# Patient Record
Sex: Male | Born: 2000 | Hispanic: Yes | Marital: Single | Smoking: Never smoker
Health system: Southern US, Community
[De-identification: ages and names within clinical notes are randomized; demographics above are authoritative.]

## PROBLEM LIST (undated history)

## (undated) ENCOUNTER — Ambulatory Visit: Admission: EM | Payer: Commercial Managed Care - HMO

## (undated) DIAGNOSIS — S60511A Abrasion of right hand, initial encounter: Secondary | ICD-10-CM

## (undated) DIAGNOSIS — R609 Edema, unspecified: Secondary | ICD-10-CM

---
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
## 2019-01-16 ENCOUNTER — Ambulatory Visit (INDEPENDENT_AMBULATORY_CARE_PROVIDER_SITE_OTHER): Payer: Self-pay

## 2019-01-16 ENCOUNTER — Ambulatory Visit: Payer: Self-pay | Admitting: Physician Assistant

## 2019-01-16 ENCOUNTER — Encounter: Payer: Self-pay | Admitting: Physician Assistant

## 2019-01-16 ENCOUNTER — Ambulatory Visit (INDEPENDENT_AMBULATORY_CARE_PROVIDER_SITE_OTHER): Payer: Self-pay | Admitting: Physician Assistant

## 2019-01-16 VITALS — BP 100/62 | HR 81 | Temp 98.3°F | Ht 70.0 in | Wt 178.0 lb

## 2019-01-16 DIAGNOSIS — Z1159 Encounter for screening for other viral diseases: Secondary | ICD-10-CM

## 2019-01-16 DIAGNOSIS — R1032 Left lower quadrant pain: Secondary | ICD-10-CM

## 2019-01-16 DIAGNOSIS — R197 Diarrhea, unspecified: Secondary | ICD-10-CM

## 2019-01-16 LAB — SARS CORONAVIRUS 2 (TAT 6-24 HRS): SARS Coronavirus 2: NEGATIVE

## 2019-01-16 MED ORDER — NA SULFATE-K SULFATE-MG SULF 17.5-3.13-1.6 GM/177ML PO SOLN: 1.0000 | Freq: Once | ORAL | 0 refills | Status: AC

## 2019-01-16 NOTE — Progress Notes (Signed)
Chief Complaint: Left lower quadrant abdominal pain and diarrhea  HPI:    Aaron Shea is a 18 year old Hispanic male, who presents clinic accompanied by his mother who assists with history, who was referred to me by Angeline Slim, MD for a complaint of left lower quadrant abdominal pain and diarrhea.      11/01/2018 patient seen in the ED for persistent left lower quadrant abdominal pain and intermittent episodes of nausea vomiting and diarrhea.  Labs including lipase, CMP and CBC were normal.  CT abdomen pelvis with contrast with no acute abdominopelvic abnormality.  Patient was given Bentyl.    Today, the patient tells me that back in March he was out in the yard playing and all of a sudden started with an acute left lower quadrant abdominal pain which resulted in 2 days of diarrhea 3-4 times a day with continual abdominal cramping rated as a 5-6/10.  He went to see an urgent care center who diagnosed him with diverticulitis and gave him Ciprofloxacin.  The pain went away after about 2 to 3 days.  He then continued to have this left lower quadrant pain about once a month for 2 or 3 days which is crampy with no radiation, which also results in more frequent loose stools during those days with some nausea.  In September he went to the ER during one of these periods (labs and imaging as above).  At that time he was given Levsin but he does not think it really helped.    Social history positive for working with his father in [employer].    Denies family history of IBD.  Denies fever, chills, weight loss, anorexia, food triggers, activity triggers, blood in his stool or symptoms that awaken him from sleep.  Past Medical History:  Diagnosis Date  . Abrasion of right hand 10/15/2014   dorsum  . Soft tissue swelling of back 10/2014   right    Past Surgical History:  Procedure Laterality Date  . MASS EXCISION Right 10/21/2014   Procedure: EXCISION OF SOFT TISSUE SWELLING ON RIGHT BACK ;   Surgeon: Gerald Stabs, MD;  Location: [facility];  Service: Pediatrics;  Laterality: Right;    Current Outpatient Medications  Medication Sig Dispense Refill  . dicyclomine (BENTYL) 20 MG tablet Take 1 tablet (20 mg total) by mouth every 6 (six) hours as needed for spasms (abdominal cramping). 15 tablet 0  . ondansetron (ZOFRAN ODT) 4 MG disintegrating tablet Take 1 tablet (4 mg total) by mouth every 8 (eight) hours as needed for nausea or vomiting. 6 tablet 0   No current facility-administered medications for this visit.     Allergies as of 01/16/2019  . (No Known Allergies)    Family History  Problem Relation Age of Onset  . Hypertension Paternal Grandmother   . Healthy Mother   . Healthy Father   . Colon cancer Neg Hx   . Pancreatic cancer Neg Hx   . Colon polyps Neg Hx   . Stomach cancer Neg Hx     Social History   Socioeconomic History  . Marital status: Single    Spouse name: Not on file  . Number of children: Not on file  . Years of education: Not on file  . Highest education level: Not on file  Occupational History  . Not on file  Social Needs  . Financial resource strain: Not on file  . Food insecurity    Worry: Not on file  Inability: Not on file  . Transportation needs    Medical: Not on file    Non-medical: Not on file  Tobacco Use  . Smoking status: Never Smoker  . Smokeless tobacco: Never Used  Substance and Sexual Activity  . Alcohol use: No  . Drug use: No  . Sexual activity: Yes    Partners: Female    Birth control/protection: Condom  Lifestyle  . Physical activity    Days per week: Not on file    Minutes per session: Not on file  . Stress: Not on file  Relationships  . Social Herbalist on phone: Not on file    Gets together: Not on file    Attends religious service: Not on file    Active member of club or organization: Not on file    Attends meetings of clubs or organizations: Not on file    Relationship  status: Not on file  . Intimate partner violence    Fear of current or ex partner: Not on file    Emotionally abused: Not on file    Physically abused: Not on file    Forced sexual activity: Not on file  Other Topics Concern  . Not on file  Social History Narrative  . Not on file    Review of Systems:    Constitutional: No weight loss, fever or chills Skin: No rash  Cardiovascular: No chest pain Respiratory: No SOB  Gastrointestinal: See HPI and otherwise negative Genitourinary: No dysuria  Neurological: No headache, dizziness or syncope Musculoskeletal: No new muscle or joint pain Hematologic: No bleeding  Psychiatric: No history of depression or anxiety   Physical Exam:  Vital signs: BP 100/62   Pulse 81   Temp 98.3 F (36.8 C)   Ht 5\' 10"  (1.778 m)   Wt 178 lb (80.7 kg)   BMI 25.54 kg/m   Constitutional:   Pleasant Hispanic male appears to be in NAD, Well developed, Well nourished, alert and cooperative Head:  Normocephalic and atraumatic. Eyes:   PEERL, EOMI. No icterus. Conjunctiva pink. Ears:  Normal auditory acuity. Neck:  Supple Throat: Oral cavity and pharynx without inflammation, swelling or lesion.  Respiratory: Respirations even and unlabored. Lungs clear to auscultation bilaterally.   No wheezes, crackles, or rhonchi.  Cardiovascular: Normal S1, S2. No MRG. Regular rate and rhythm. No peripheral edema, cyanosis or pallor.  Gastrointestinal:  Soft, nondistended, mild LLQ ttp to deep palpation, No rebound or guarding. Normal bowel sounds. No appreciable masses or hepatomegaly. Rectal:  Not performed.  Msk:  Symmetrical without gross deformities. Without edema, no deformity or joint abnormality.  Neurologic:  Alert and  oriented x4;  grossly normal neurologically.  Skin:   Dry and intact without significant lesions or rashes. Psychiatric:  Demonstrates good judgement and reason without abnormal affect or behaviors.  RELEVANT LABS AND IMAGING: CBC     Component Value Date/Time   WBC 8.3 11/01/2018 1916   RBC 5.11 11/01/2018 1916   HGB 15.4 11/01/2018 1916   HCT 46.4 11/01/2018 1916   PLT 220 11/01/2018 1916   MCV 90.8 11/01/2018 1916   MCH 30.1 11/01/2018 1916   MCHC 33.2 11/01/2018 1916   RDW 11.3 (L) 11/01/2018 1916    CMP     Component Value Date/Time   NA 138 11/01/2018 1916   K 3.9 11/01/2018 1916   CL 102 11/01/2018 1916   CO2 27 11/01/2018 1916   GLUCOSE 103 (H) 11/01/2018 1916  BUN 11 11/01/2018 1916   CREATININE 1.00 11/01/2018 1916   CALCIUM 9.2 11/01/2018 1916   PROT 7.6 11/01/2018 1916   ALBUMIN 4.4 11/01/2018 1916   AST 27 11/01/2018 1916   ALT 32 11/01/2018 1916   ALKPHOS 53 11/01/2018 1916   BILITOT 0.7 11/01/2018 1916   GFRNONAA >60 11/01/2018 1916   GFRAA >60 11/01/2018 1916    Assessment: 1.  Left lower quadrant abdominal pain: Cyclical, for 2 to 3 days once a month for the past 9 months, ER evaluation during 1 of these episodes with negative/normal CT and labs, always accompanied by diarrhea; consider IBD versus IBS versus other 2.  Diarrhea  Plan: 1.  Scheduled patient for a diagnostic colonoscopy in the [place] with Dr. Ardis Hughs.  Did discuss risks, benefits, limitations and alternatives and patient agrees to proceed.  Patient will be Covid tested 2 days prior to time of exam. 2.  Patient to follow in clinic per recommendations from Dr. Ardis Hughs after time of procedure.  Ellouise Newer, PA-C [facility] 01/16/2019, 11:26 AM  Cc: Angeline Slim, MD

## 2019-01-16 NOTE — Patient Instructions (Signed)
If you are age 19 or older, your body mass index should be between 23-30. Your Body mass index is 25.54 kg/m. If this is out of the aforementioned range listed, please consider follow up with your Primary Care Provider.  If you are age 29 or younger, your body mass index should be between 19-25. Your Body mass index is 25.54 kg/m. If this is out of the aformentioned range listed, please consider follow up with your Primary Care Provider.   You have been scheduled for a colonoscopy. Please follow written instructions given to you at your visit today.  Please pick up your prep supplies at the pharmacy within the next 1-3 days. If you use inhalers (even only as needed), please bring them with you on the day of your procedure.  Thank you for choosing me and [facility]

## 2019-01-16 NOTE — Progress Notes (Signed)
I agree with the above note, plan 

## 2019-01-20 ENCOUNTER — Ambulatory Visit (AMBULATORY_SURGERY_CENTER): Payer: Self-pay | Admitting: Gastroenterology

## 2019-01-20 ENCOUNTER — Other Ambulatory Visit: Payer: Self-pay

## 2019-01-20 ENCOUNTER — Encounter: Payer: Self-pay | Admitting: Gastroenterology

## 2019-01-20 VITALS — BP 105/57 | HR 80 | Temp 98.8°F | Resp 13 | Ht 70.0 in | Wt 178.0 lb

## 2019-01-20 DIAGNOSIS — R1032 Left lower quadrant pain: Secondary | ICD-10-CM

## 2019-01-20 MED ORDER — HYOSCYAMINE SULFATE 0.125 MG SL SUBL: SUBLINGUAL_TABLET | SUBLINGUAL | 3 refills | Status: DC

## 2019-01-20 MED ORDER — SODIUM CHLORIDE 0.9 % IV SOLN: 500.0000 mL | Freq: Once | INTRAVENOUS | Status: DC

## 2019-01-20 NOTE — Patient Instructions (Signed)
YOU HAD AN ENDOSCOPIC PROCEDURE TODAY AT [facility]:   Refer to the procedure report that was given to you for any specific questions about what was found during the examination.  If the procedure report does not answer your questions, please call your gastroenterologist to clarify.  If you requested that your care partner not be given the details of your procedure findings, then the procedure report has been included in a sealed envelope for you to review at your convenience later.  YOU SHOULD EXPECT: Some feelings of bloating in the abdomen. Passage of more gas than usual.  Walking can help get rid of the air that was put into your GI tract during the procedure and reduce the bloating. If you had a lower endoscopy (such as a colonoscopy or flexible sigmoidoscopy) you may notice spotting of blood in your stool or on the toilet paper. If you underwent a bowel prep for your procedure, you may not have a normal bowel movement for a few days.  Please Note:  You might notice some irritation and congestion in your nose or some drainage.  This is from the oxygen used during your procedure.  There is no need for concern and it should clear up in a day or so.  SYMPTOMS TO REPORT IMMEDIATELY:   Following lower endoscopy (colonoscopy or flexible sigmoidoscopy):  Excessive amounts of blood in the stool  Significant tenderness or worsening of abdominal pains  Swelling of the abdomen that is new, acute  Fever of 100F or higher   For urgent or emergent issues, a gastroenterologist can be reached at any hour by calling [phone].   DIET:  We do recommend a small meal at first, but then you may proceed to your regular diet.  Drink plenty of fluids but you should avoid alcoholic beverages for 24 hours.  ACTIVITY:  You should plan to take it easy for the rest of today and you should NOT DRIVE or use heavy machinery until tomorrow (because of the sedation medicines used during the test).     FOLLOW UP: Our staff will call the number listed on your records 48-72 hours following your procedure to check on you and address any questions or concerns that you may have regarding the information given to you following your procedure. If we do not reach you, we will leave a message.  We will attempt to reach you two times.  During this call, we will ask if you have developed any symptoms of COVID 19. If you develop any symptoms (ie: fever, flu-like symptoms, shortness of breath, cough etc.) before then, please call [phone].  If you test positive for Covid 19 in the 2 weeks post procedure, please call and report this information to [place].    If any biopsies were taken you will be contacted by phone or by letter within the next 1-3 weeks.  Please call us at [phone] if you have not heard about the biopsies in 3 weeks.    SIGNATURES/CONFIDENTIALITY: You and/or your care partner have signed paperwork which will be entered into your electronic medical record.  These signatures attest to the fact that that the information above on your After Visit Summary has been reviewed and is understood.  Full responsibility of the confidentiality of this discharge information lies with you and/or your care-partner.   Take levsin 0.125 mg 1-2 under tongue prn for cramping,resume remainder of medication. Call Dr. Ardis Hughs office 3-4 weeks to report response.

## 2019-01-20 NOTE — Progress Notes (Signed)
A and O x3. Report to RN. Tolerated MAC anesthesia well.

## 2019-01-20 NOTE — Op Note (Signed)
[facility] Patient Name: Aaron Shea Procedure Date: 01/20/2019 2:01 PM MRN: [id number] Endoscopist: Milus Banister , MD Age: 18 Referring MD:  Date of Birth: 2000-04-16 Gender: Male Account #: [id number] Procedure:                Colonoscopy Indications:              Abdominal pain in the left lower quadrant Medicines:                Monitored Anesthesia Care Procedure:                Pre-Anesthesia Assessment:                           - Prior to the procedure, a History and Physical                            was performed, and patient medications and                            allergies were reviewed. The patient's tolerance of                            previous anesthesia was also reviewed. The risks                            and benefits of the procedure and the sedation                            options and risks were discussed with the patient.                            All questions were answered, and informed consent                            was obtained. Prior Anticoagulants: The patient has                            taken no previous anticoagulant or antiplatelet                            agents. ASA Grade Assessment: I - A normal, healthy                            patient. After reviewing the risks and benefits,                            the patient was deemed in satisfactory condition to                            undergo the procedure.                           After obtaining informed consent, the colonoscope  was passed under direct vision. Throughout the                            procedure, the patient's blood pressure, pulse, and                            oxygen saturations were monitored continuously. The                            Colonoscope was introduced through the anus and                            advanced to the the terminal ileum. The colonoscopy                            was performed without  difficulty. The patient                            tolerated the procedure well. The quality of the                            bowel preparation was excellent. Scope In: 2:12:55 PM Scope Out: 2:23:59 PM Scope Withdrawal Time: 0 hours 8 minutes 14 seconds  Total Procedure Duration: 0 hours 11 minutes 4 seconds  Findings:                 The terminal ileum appeared normal.                           The entire examined colon appeared normal on direct                            and retroflexion views. Complications:            No immediate complications. Estimated blood loss:                            None. Estimated Blood Loss:     Estimated blood loss: none. Impression:               - The examined portion of the ileum was normal.                           - The entire examined colon is normal on direct and                            retroflexion views.                           - No specimens collected. Recommendation:           - Patient has a contact number available for                            emergencies. The signs and symptoms of potential  delayed complications were discussed with the                            patient. Return to normal activities tomorrow.                            Written discharge instructions were provided to the                            patient.                           - Resume previous diet.                           - Continue present medications. New script today:                            Trial of levsin 0.125mg  SL tabs, take 1-2 tabs                            under tongue PRN cramping, disp 50 with 3 refills                            and call Dr. Ardis Hughs' office in 3-4 weeks to report                            on your response. Milus Banister, MD 01/20/2019 2:29:30 PM This report has been signed electronically.

## 2019-01-22 ENCOUNTER — Telehealth: Payer: Self-pay | Admitting: *Deleted

## 2019-01-22 NOTE — Telephone Encounter (Signed)
  Follow up Call-  Call back number 01/20/2019  Post procedure Call Back phone  # [phone]  Permission to leave phone message Yes  Some recent data might be hidden     Patient questions:  Do you have a fever, pain , or abdominal swelling? No. Pain Score  0 *  Have you tolerated food without any problems? Yes.    Have you been able to return to your normal activities? Yes.    Do you have any questions about your discharge instructions: Diet   No. Medications  No. Follow up visit  No.  Do you have questions or concerns about your Care? No.  Actions: * If pain score is 4 or above: 1. No action needed, pain <4.Have you developed a fever since your procedure? no  2.   Have you had an respiratory symptoms (SOB or cough) since your procedure? no  3.   Have you tested positive for COVID 19 since your procedure no  4.   Have you had any family members/close contacts diagnosed with the COVID 19 since your procedure?  no   If yes to any of these questions please route to Joylene John, RN and Alphonsa Gin, Therapist, sports.

## 2019-01-26 ENCOUNTER — Telehealth: Payer: Self-pay | Admitting: Gastroenterology

## 2019-01-26 DIAGNOSIS — R1032 Left lower quadrant pain: Secondary | ICD-10-CM

## 2019-01-26 NOTE — Telephone Encounter (Signed)
The pt continues to have LLQ pain.  Had colonoscopy on 12/8 and states the pain has not gotten any better but is no worse. Has tried levsin but he states it makes no difference in the pain when he takes it.  Please advise

## 2019-01-29 ENCOUNTER — Other Ambulatory Visit (INDEPENDENT_AMBULATORY_CARE_PROVIDER_SITE_OTHER): Payer: Medicaid Other

## 2019-01-29 DIAGNOSIS — R1032 Left lower quadrant pain: Secondary | ICD-10-CM

## 2019-01-29 LAB — CBC WITH DIFFERENTIAL/PLATELET
Basophils Absolute: 0.1 10*3/uL (ref 0.0–0.1)
Basophils Relative: 1 % (ref 0.0–3.0)
Eosinophils Absolute: 0.2 10*3/uL (ref 0.0–0.7)
Eosinophils Relative: 2.1 % (ref 0.0–5.0)
HCT: 46.1 % (ref 36.0–49.0)
Hemoglobin: 16.1 g/dL — ABNORMAL HIGH (ref 12.0–16.0)
Lymphocytes Relative: 34.8 % (ref 24.0–48.0)
Lymphs Abs: 2.9 10*3/uL (ref 0.7–4.0)
MCHC: 34.9 g/dL (ref 31.0–37.0)
MCV: 87 fl (ref 78.0–98.0)
Monocytes Absolute: 0.7 10*3/uL (ref 0.1–1.0)
Monocytes Relative: 8.1 % (ref 3.0–12.0)
Neutro Abs: 4.4 10*3/uL (ref 1.4–7.7)
Neutrophils Relative %: 54 % (ref 43.0–71.0)
Platelets: 248 10*3/uL (ref 150.0–575.0)
RBC: 5.3 Mil/uL (ref 3.80–5.70)
RDW: 12.3 % (ref 11.4–15.5)
WBC: 8.2 10*3/uL (ref 4.5–13.5)

## 2019-01-29 LAB — URINALYSIS, ROUTINE W REFLEX MICROSCOPIC
Bilirubin Urine: NEGATIVE
Hgb urine dipstick: NEGATIVE
Ketones, ur: NEGATIVE
Leukocytes,Ua: NEGATIVE
Nitrite: NEGATIVE
RBC / HPF: NONE SEEN (ref 0–?)
Specific Gravity, Urine: 1.02 (ref 1.000–1.030)
Total Protein, Urine: NEGATIVE
Urine Glucose: NEGATIVE
Urobilinogen, UA: 0.2 (ref 0.0–1.0)
WBC, UA: NONE SEEN (ref 0–?)
pH: 7.5 (ref 5.0–8.0)

## 2019-01-29 LAB — COMPREHENSIVE METABOLIC PANEL
ALT: 13 U/L (ref 0–53)
AST: 13 U/L (ref 0–37)
Albumin: 4.6 g/dL (ref 3.5–5.2)
Alkaline Phosphatase: 64 U/L (ref 52–171)
BUN: 12 mg/dL (ref 6–23)
CO2: 31 mEq/L (ref 19–32)
Calcium: 9.7 mg/dL (ref 8.4–10.5)
Chloride: 101 mEq/L (ref 96–112)
Creatinine, Ser: 0.98 mg/dL (ref 0.40–1.50)
GFR: 97.8 mL/min (ref >60.00)
Glucose, Bld: 89 mg/dL (ref 70–99)
Potassium: 3.6 mEq/L (ref 3.5–5.1)
Sodium: 139 mEq/L (ref 135–145)
Total Bilirubin: 0.5 mg/dL (ref 0.2–1.2)
Total Protein: 7.8 g/dL (ref 6.0–8.3)

## 2019-01-29 NOTE — Telephone Encounter (Signed)
The pt has been advised and will come in today or tomorrow for labs and UA/cx.  The order has been added to [facility]

## 2019-01-29 NOTE — Telephone Encounter (Signed)
I am not sure what is causing these symptoms.  LEts repeat some labs (cbc, cmet, UA and reflex uCx).  thanks

## 2019-01-29 NOTE — Telephone Encounter (Signed)
Left message on machine to call back  

## 2019-01-29 NOTE — Telephone Encounter (Signed)
Delia, interpreter (on HIPAA) called to ask for an update.

## 2019-01-30 LAB — URINE CULTURE
MICRO NUMBER:: 1208507
Result:: NO GROWTH
SPECIMEN QUALITY:: ADEQUATE

## 2019-02-02 ENCOUNTER — Telehealth: Payer: Self-pay | Admitting: Gastroenterology

## 2019-02-02 NOTE — Telephone Encounter (Signed)
Left message for pt to call back  °

## 2019-02-02 NOTE — Telephone Encounter (Signed)
Pt's friend Delia called asking to speak with you. She id not want to disclose more information. Pls call her.

## 2019-02-02 NOTE — Telephone Encounter (Signed)
See result note.  

## 2020-12-09 IMAGING — CT CT ABD-PELV W/ CM
2 of 4 series · 16 of 46 positions shown, 18 images · IV contrast (omnipaque)
Comparison: None.

CLINICAL DATA: Abdominal pain. Left lower quadrant pain x3 days.
The patient has a history of diverticulitis.

EXAM:
CT ABDOMEN AND PELVIS WITH CONTRAST
TECHNIQUE: Multidetector CT imaging of the abdomen and pelvis was performed
using the standard protocol following bolus administration of
intravenous contrast.
CONTRAST:  100mL OMNIPAQUE IOHEXOL 300 MG/ML  SOLN

[Series 2: axial st · axial · 0.67mm/px · z∈[-470,-46]mm · 13 of 95 slices shown, 15 images]
[im 5/95  soft-tissue]
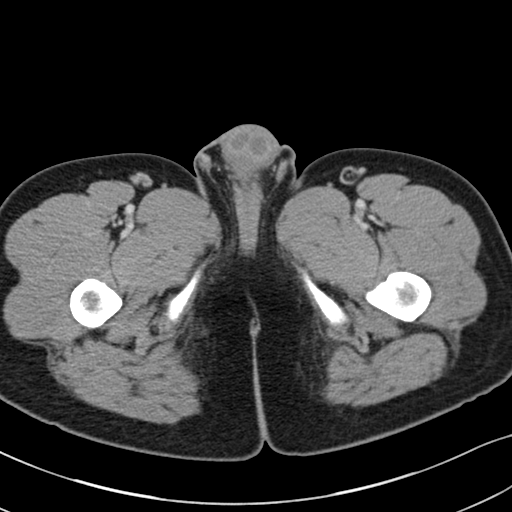
[im 5/95  bone]
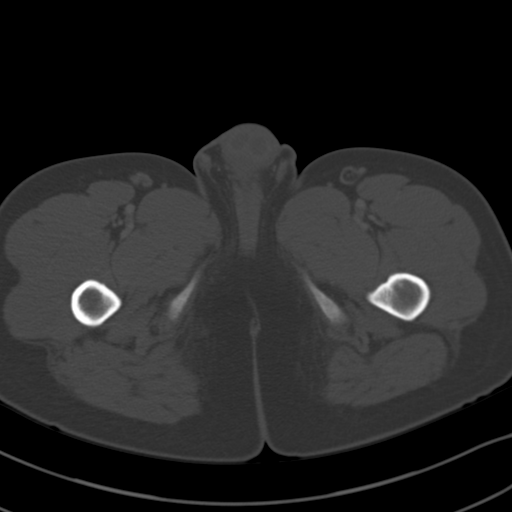
[im 15/95  soft-tissue]
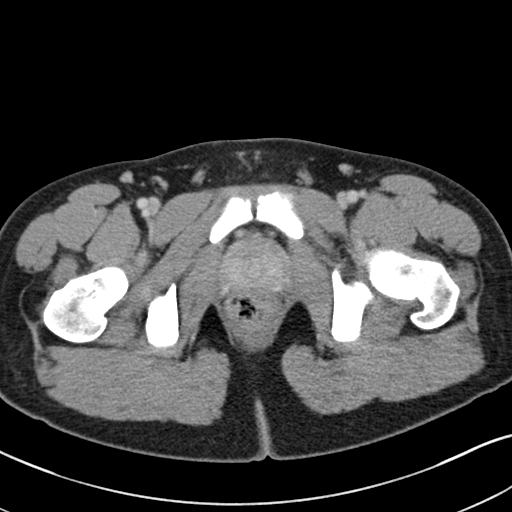
[im 19/95  soft-tissue]
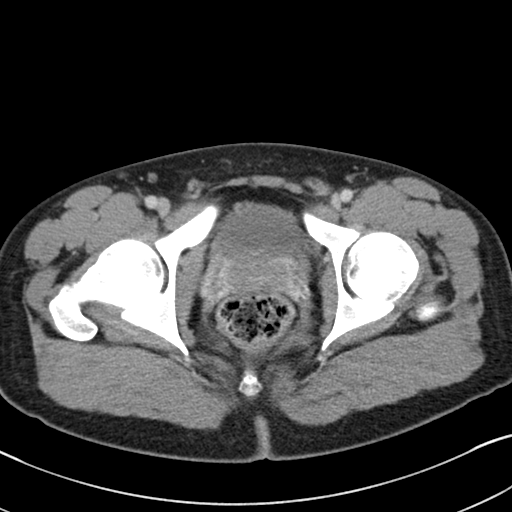
[im 29/95  soft-tissue]
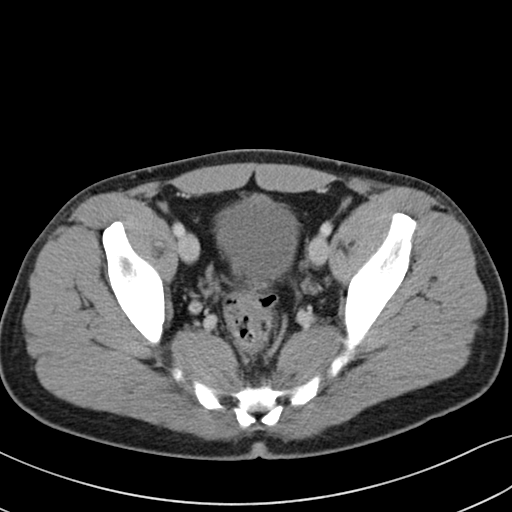
[im 33/95  soft-tissue]
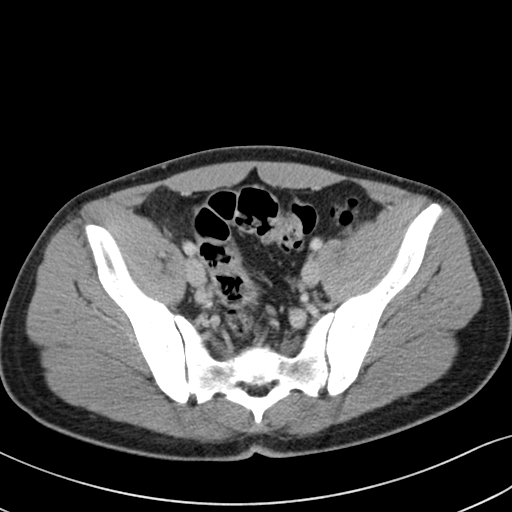
[im 43/95  soft-tissue]
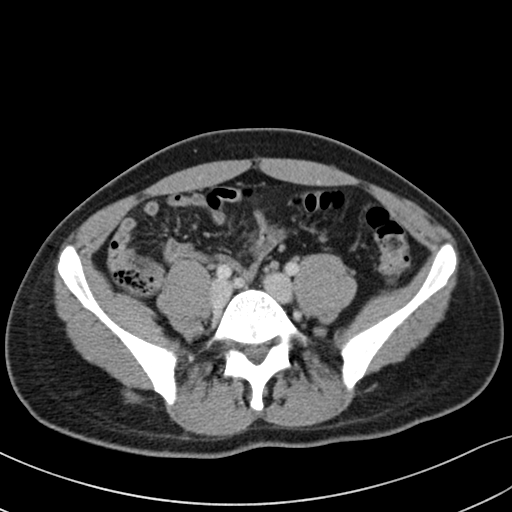
[im 48/95  soft-tissue]
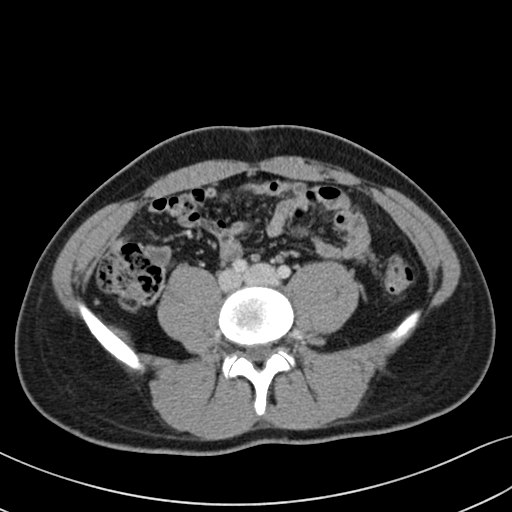
[im 52/95  soft-tissue]
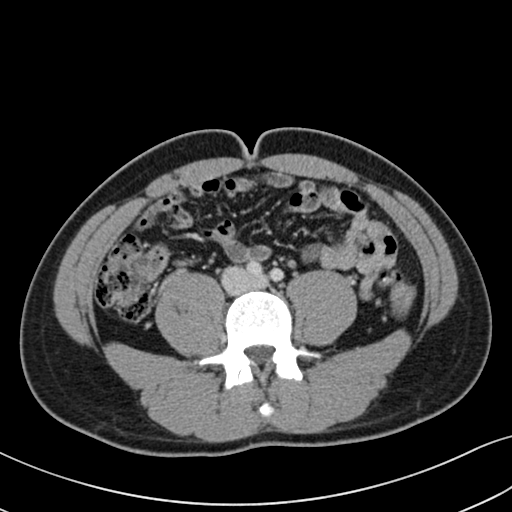
[im 62/95  soft-tissue]
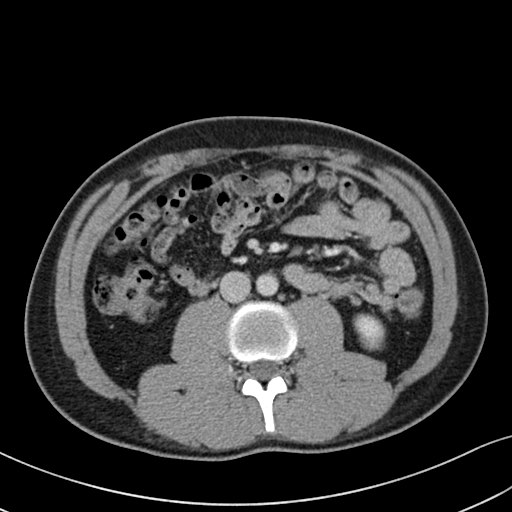
[im 62/95  bone]
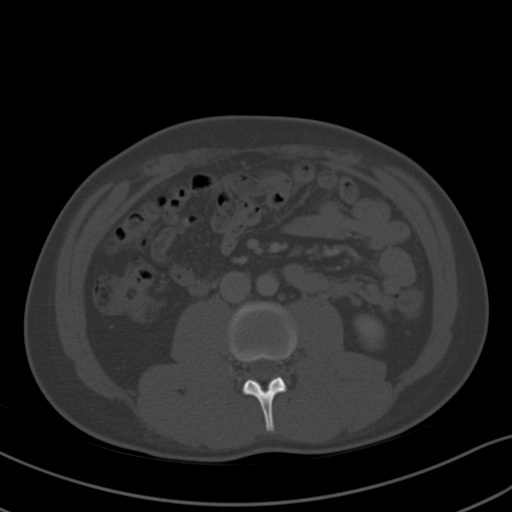
[im 66/95  soft-tissue]
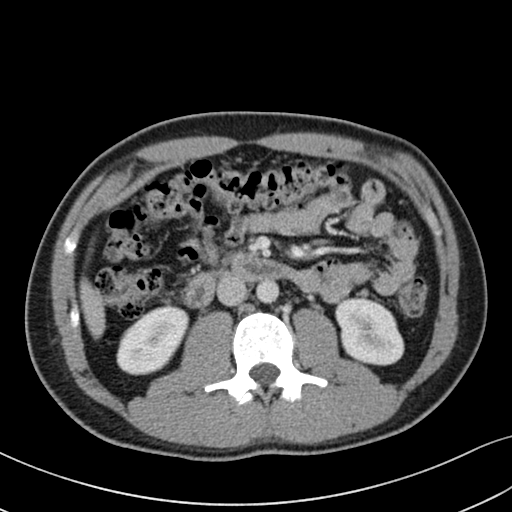
[im 76/95  soft-tissue]
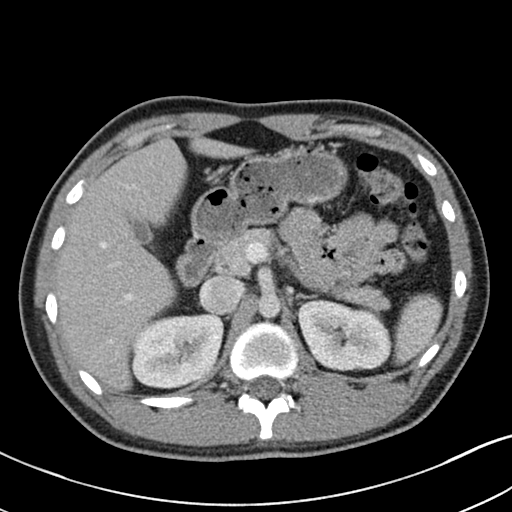
[im 80/95  soft-tissue]
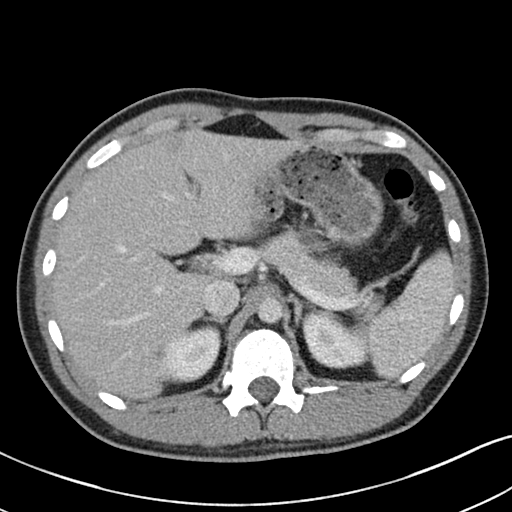
[im 90/95  soft-tissue]
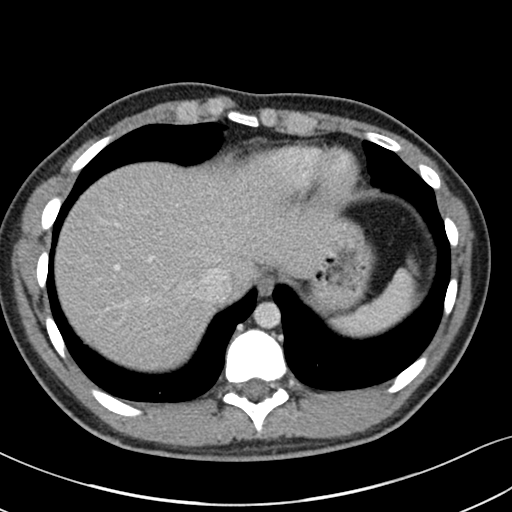

[Series 5: coronal st · coronal · 0.68mm/px · 3 of 136 slices shown]
[im 46/136  soft-tissue]
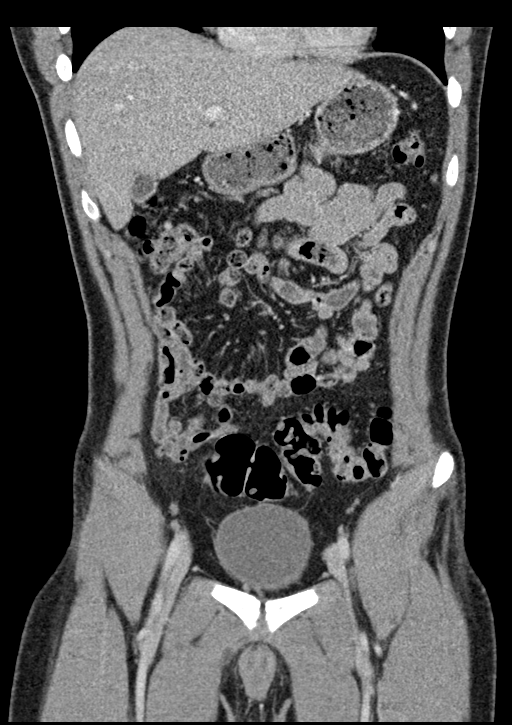
[im 61/136  soft-tissue]
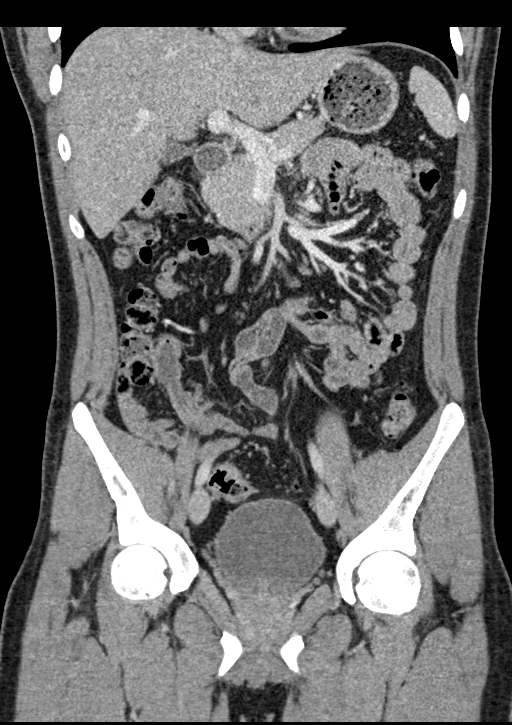
[im 76/136  soft-tissue]
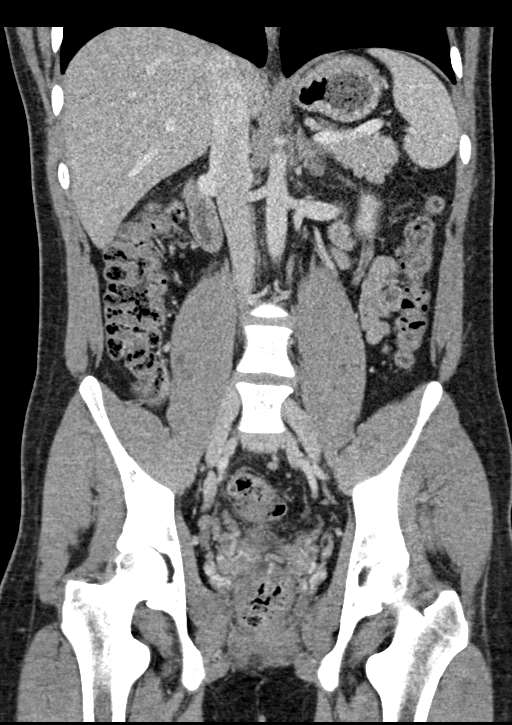

[16 of 46 positions shown; findings below may reference images not displayed]

FINDINGS: Lower chest: The lung bases are clear. The heart size is normal.

Hepatobiliary: The liver is normal. Normal gallbladder.There is no
biliary ductal dilation.

Pancreas: Normal contours without ductal dilatation. No
peripancreatic fluid collection.

Spleen: No splenic laceration or hematoma.

Adrenals/Urinary Tract:

--Adrenal glands: No adrenal hemorrhage.

--Right kidney/ureter: No hydronephrosis or perinephric hematoma.

--Left kidney/ureter: No hydronephrosis or perinephric hematoma.

--Urinary bladder: Unremarkable.

Stomach/Bowel:

--Stomach/Duodenum: No hiatal hernia or other gastric abnormality.
Normal duodenal course and caliber.

--Small bowel: No dilatation or inflammation.

--Colon: No focal abnormality.

--Appendix: Normal.

Vascular/Lymphatic: Normal course and caliber of the major abdominal
vessels.

--No retroperitoneal lymphadenopathy.

--No mesenteric lymphadenopathy.

--No pelvic or inguinal lymphadenopathy.

Reproductive: Unremarkable

Other: No ascites or free air. The abdominal wall is normal.

Musculoskeletal. No acute displaced fractures.
IMPRESSION: No acute abdominopelvic abnormality.

## 2021-09-23 ENCOUNTER — Other Ambulatory Visit: Payer: Self-pay

## 2021-09-23 ENCOUNTER — Encounter (HOSPITAL_COMMUNITY): Payer: Self-pay

## 2021-09-23 ENCOUNTER — Emergency Department (HOSPITAL_COMMUNITY)
Admission: EM | Admit: 2021-09-23 | Discharge: 2021-09-23 | Disposition: A | Discharge status: Home / Self Care | Payer: Commercial Managed Care - HMO | Attending: Emergency Medicine | Admitting: Emergency Medicine

## 2021-09-23 DIAGNOSIS — T7840XA Allergy, unspecified, initial encounter: Secondary | ICD-10-CM | POA: Insufficient documentation

## 2021-09-23 MED ORDER — DIPHENHYDRAMINE HCL 50 MG/ML IJ SOLN
25.0000 mg | Freq: Once | INTRAMUSCULAR | Status: AC
  Administered 2021-09-23: 25 mg via INTRAVENOUS
  Filled 2021-09-23: qty 1

## 2021-09-23 MED ORDER — FAMOTIDINE IN NACL 20-0.9 MG/50ML-% IV SOLN
20.0000 mg | Freq: Once | INTRAVENOUS | Status: AC
  Administered 2021-09-23: 20 mg via INTRAVENOUS
  Filled 2021-09-23: qty 50

## 2021-09-23 MED ORDER — METHYLPREDNISOLONE SODIUM SUCC 125 MG IJ SOLR
125.0000 mg | Freq: Once | INTRAMUSCULAR | Status: AC
  Administered 2021-09-23: 125 mg via INTRAVENOUS
  Filled 2021-09-23: qty 2

## 2021-09-23 MED ORDER — PREDNISONE 10 MG PO TABS: 20.0000 mg | ORAL_TABLET | Freq: Every day | ORAL | 0 refills | Status: DC

## 2021-09-23 NOTE — ED Provider Notes (Signed)
[facility] DEPT Provider Note   CSN: [id number] Arrival date & time: 09/23/21  2005     History  Chief Complaint  Patient presents with   Allergic Reaction    Aaron Shea is a 21 y.o. male.   Allergic Reaction   Patient with no documented comorbidities presents today due to possible allergic reaction.  Patient was working in the yard, states he was stung by 3 different bees.  Started having itching and feeling there is a tingling in the back of his throat.  This happened at about 7 PM, an hour prior to arrival in the ED.  He has not had any medicine prior to arrival, denies any history of anaphylaxis.  He has been stung by bees before and has had itching but no history of severe allergic reaction or needing an EpiPen or hospitalization.  Denies any chest pain, shortness of breath, nausea, vomiting, abdominal pain.  Home Medications Prior to Admission medications   Medication Sig Start Date End Date Taking? Authorizing Provider  predniSONE (DELTASONE) 10 MG tablet Take 2 tablets (20 mg total) by mouth daily. 09/23/21  Yes Sherrill Raring, PA-C  dicyclomine (BENTYL) 20 MG tablet Take 1 tablet (20 mg total) by mouth every 6 (six) hours as needed for spasms (abdominal cramping). 11/01/18   Francine Graven, DO  hyoscyamine (LEVSIN SL) 0.125 MG SL tablet Take 1-2 tabs under tongue prn cramping,dispense 50 01/20/19   Milus Banister, MD  ondansetron (ZOFRAN ODT) 4 MG disintegrating tablet Take 1 tablet (4 mg total) by mouth every 8 (eight) hours as needed for nausea or vomiting. 11/01/18   Francine Graven, DO      Allergies    Patient has no known allergies.    Review of Systems   Review of Systems  Physical Exam Updated Vital Signs BP 117/68   Pulse 68   Temp 98.3 F (36.8 C) (Oral)   Resp (!) 32   Ht '5\' 10"'$  (1.778 m)   Wt 88 kg   SpO2 100%   BMI 27.84 kg/m  Physical Exam Vitals and nursing note reviewed. Exam conducted with a chaperone  present.  Constitutional:      Appearance: Normal appearance.  HENT:     Head: Normocephalic and atraumatic.     Mouth/Throat:     Comments: Uvula is midline, no oropharyngeal swelling.  Protecting airway.  Handling secretions.  Normal phonation. Eyes:     General: No scleral icterus.       Right eye: No discharge.        Left eye: No discharge.     Extraocular Movements: Extraocular movements intact.     Pupils: Pupils are equal, round, and reactive to light.  Cardiovascular:     Rate and Rhythm: Normal rate and regular rhythm.     Pulses: Normal pulses.     Heart sounds: Normal heart sounds. No murmur heard.    No friction rub. No gallop.  Pulmonary:     Effort: Pulmonary effort is normal. No respiratory distress.     Breath sounds: Normal breath sounds.     Comments: No stridor Abdominal:     General: Abdomen is flat. Bowel sounds are normal. There is no distension.     Palpations: Abdomen is soft.     Tenderness: There is no abdominal tenderness.  Skin:    General: Skin is warm and dry.     Coloration: Skin is not jaundiced.     Comments: Raised erythema  to left shoulder, right lower quadrant.  No dermatographia.  Neurological:     Mental Status: He is alert. Mental status is at baseline.     Coordination: Coordination normal.     ED Results / Procedures / Treatments   Labs (all labs ordered are listed, but only abnormal results are displayed) Labs Reviewed - No data to display  EKG None  Radiology No results found.  Procedures Procedures    Medications Ordered in ED Medications  diphenhydrAMINE (BENADRYL) injection 25 mg (25 mg Intravenous Given 09/23/21 2021)  methylPREDNISolone sodium succinate (SOLU-MEDROL) 125 mg/2 mL injection 125 mg (125 mg Intravenous Given 09/23/21 2022)  famotidine (PEPCID) IVPB 20 mg premix (0 mg Intravenous Stopped 09/23/21 2052)    ED Course/ Medical Decision Making/ A&P                           Medical Decision  Making Risk Prescription drug management.   Patient is a 21 year old male presenting today due to allergic reaction.  Differential includes but not limited to anaphylaxis, allergic response, angioedema.  On exam patient is protecting his airway, there is no signs of angioedema.  No change in phonation, uvula is midline and I do not think he is in anaphylaxis.  Exam is notable for raised skins lesions consistent with dermatitis versus early hives.  I considered given epinephrine but patient is not currently in anaphylaxis and I think this is more likely an allergic response.    I ordered Solu-Medrol 125 IV, 20 Pepcid IV, 25 Benadryl IV.  Patient is on cardiac monitoring and we will observe.  I reevaluate the patient about 30 minutes after being given his medicine.  He is feeling significantly improved, denies any tingling in the back of his throat.  Denies any dysphagia, new symptoms.  I do not think patient needs admission or additional observation or epinephrine based on exam and reassuring response.  I discussed with him indications for an EpiPen and I will send 1 to his pharmacy in case he develops a worse allergic reaction, he verbalized understanding that if he were to use the EpiPen he is to call EMS due to concern for arrhythmia and he will come to the ED for evaluation.  We will send steroid burst.   nu      Final Clinical Impression(s) / ED Diagnoses Final diagnoses:  Allergic reaction, initial encounter    Rx / DC Orders ED Discharge Orders          Ordered    predniSONE (DELTASONE) 10 MG tablet  Daily        09/23/21 2105              Sherrill Raring, PA-C 09/23/21 2251    Tretha Sciara, MD 09/23/21 2259

## 2021-09-23 NOTE — Discharge Instructions (Addendum)
We saw you in the ER after you had the allergic reaction.  The reaction is severe, however, it appears to be in control and there is no increased swelling or any difficulty in breathing noted. We are not sure what caused the reaction, and it is important for you to follow up with an allergist. Please take the medications prescribed. PLEASE RETURN TO THE ER IMMEDIATELY IN CASE YOU START HAVING WORSENING SWELLING, DIFFICULTY IN BREATHING ETC.  

## 2021-09-23 NOTE — ED Provider Triage Note (Signed)
Emergency Medicine Provider Triage Evaluation Note  Aaron Shea , a 21 y.o. male  was evaluated in triage.  Pt complains of bee sting about an hour prior to arrival.  He is having hives, feels like his throat is closing up.  History of allergic reactions but never requiring EpiPen, no history of anaphylaxis.  Denies any shortness of breath, nausea, vomiting, abdominal pain, chest pain, shortness of breath.  Patient is companied by his girlfriend..  Review of Systems  PI  Physical Exam  BP (!) 133/90   Pulse 80   Temp 98.6 F (37 C) (Oral)   Resp 12   Ht '5\' 10"'$  (1.778 m)   Wt 88 kg   SpO2 100%   BMI 27.84 kg/m  Gen:   Awake, no distress   Resp:  Normal effort  MSK:   Moves extremities without difficulty  Other:  3 raised erythematous lesions consistent with bee stings, right lower quadrant, left shoulder, chest wall.  Protecting airway, uvula midline.  No stridor on exam.  Medical Decision Making  Medically screening exam initiated at 8:14 PM.  Appropriate orders placed.  Sadrac Zeoli was informed that the remainder of the evaluation will be completed by another provider, this initial triage assessment does not replace that evaluation, and the importance of remaining in the ED until their evaluation is complete.  Allergy meds ordered, patient is not currently in anaphylaxis so we will abstain from epinephrine.  Next room.   Sherrill Raring, PA-C 09/23/21 2015

## 2021-09-23 NOTE — ED Triage Notes (Signed)
Pt has several insect bites to chest and arms, slight swelling in the throat/voice change

## 2022-12-31 ENCOUNTER — Ambulatory Visit (INDEPENDENT_AMBULATORY_CARE_PROVIDER_SITE_OTHER): Payer: 59 | Admitting: Nurse Practitioner

## 2022-12-31 ENCOUNTER — Encounter: Payer: Self-pay | Admitting: Nurse Practitioner

## 2022-12-31 VITALS — BP 114/63 | HR 79 | Temp 97.2°F | Ht 70.0 in | Wt 146.0 lb

## 2022-12-31 DIAGNOSIS — Z0000 Encounter for general adult medical examination without abnormal findings: Secondary | ICD-10-CM

## 2022-12-31 DIAGNOSIS — Z23 Encounter for immunization: Secondary | ICD-10-CM | POA: Insufficient documentation

## 2022-12-31 DIAGNOSIS — Z130 Encounter for screening for diseases of the blood and blood-forming organs and certain disorders involving the immune mechanism: Secondary | ICD-10-CM

## 2022-12-31 DIAGNOSIS — Z1329 Encounter for screening for other suspected endocrine disorder: Secondary | ICD-10-CM | POA: Diagnosis not present

## 2022-12-31 DIAGNOSIS — Z1321 Encounter for screening for nutritional disorder: Secondary | ICD-10-CM | POA: Diagnosis not present

## 2022-12-31 DIAGNOSIS — Z13228 Encounter for screening for other metabolic disorders: Secondary | ICD-10-CM

## 2022-12-31 NOTE — Progress Notes (Signed)
Complete physical exam  Patient: Aaron Shea   DOB: 2000/08/11   22 y.o. Male  MRN: [id number]  Subjective:    Chief Complaint  Patient presents with   Establish Care    Aaron Shea is a 22 y.o. male  with  no significant PMH who presents to establish care and for a complete physical exam. He reports consuming a general diet. The patient does not participate in regular exercise at present. He generally feels well. He reports sleeping well. He does not have additional problems to discuss today.   Flu vaccine given in the office today. Encouraged to get the Tdap vaccine and HPV vaccine if not up-to-date  Most recent fall risk assessment:     No data to display           Most recent depression screenings:    12/31/2022    8:51 AM  PHQ 2/9 Scores  PHQ - 2 Score 0        Patient Care Team: Donell Beers, FNP as PCP - General (Nurse Practitioner)   Outpatient Medications Prior to Visit  Medication Sig   [DISCONTINUED] dicyclomine (BENTYL) 20 MG tablet Take 1 tablet (20 mg total) by mouth every 6 (six) hours as needed for spasms (abdominal cramping).   [DISCONTINUED] hyoscyamine (LEVSIN SL) 0.125 MG SL tablet Take 1-2 tabs under tongue prn cramping,dispense 50   [DISCONTINUED] ondansetron (ZOFRAN ODT) 4 MG disintegrating tablet Take 1 tablet (4 mg total) by mouth every 8 (eight) hours as needed for nausea or vomiting. (Patient not taking: Reported on 12/31/2022)   [DISCONTINUED] predniSONE (DELTASONE) 10 MG tablet Take 2 tablets (20 mg total) by mouth daily. (Patient not taking: Reported on 12/31/2022)   [DISCONTINUED] 0.9 %  sodium chloride infusion    No facility-administered medications prior to visit.    Review of Systems  Constitutional:  Negative for appetite change, chills, fatigue and fever.  HENT:  Negative for congestion, postnasal drip, rhinorrhea and sneezing.   Eyes:  Negative for pain, discharge, itching and visual disturbance.   Respiratory:  Negative for cough, shortness of breath and wheezing.   Cardiovascular:  Negative for chest pain, palpitations and leg swelling.  Gastrointestinal:  Negative for abdominal pain, constipation, nausea and vomiting.  Endocrine: Negative for cold intolerance, heat intolerance and polydipsia.  Genitourinary:  Negative for difficulty urinating, dysuria, flank pain and frequency.  Musculoskeletal:  Negative for arthralgias, back pain, joint swelling and myalgias.  Skin:  Negative for color change, pallor, rash and wound.  Allergic/Immunologic: Negative for environmental allergies, food allergies and immunocompromised state.  Neurological:  Negative for dizziness, facial asymmetry, weakness, numbness and headaches.  Psychiatric/Behavioral:  Negative for behavioral problems, confusion, self-injury and suicidal ideas.        Objective:     BP 114/63   Pulse 79   Temp (!) 97.2 F (36.2 C)   Ht 5\' 10"  (1.778 m)   Wt 146 lb (66.2 kg)   SpO2 100%   BMI 20.95 kg/m    Physical Exam Vitals and nursing note reviewed. Exam conducted with a chaperone present.  Constitutional:      General: He is not in acute distress.    Appearance: Normal appearance. He is not ill-appearing, toxic-appearing or diaphoretic.  HENT:     Right Ear: Tympanic membrane, ear canal and external ear normal. There is no impacted cerumen.     Left Ear: Tympanic membrane, ear canal and external ear normal. There is no impacted cerumen.  Nose: Nose normal. No congestion or rhinorrhea.     Mouth/Throat:     Mouth: Mucous membranes are moist.     Pharynx: Oropharynx is clear. No oropharyngeal exudate or posterior oropharyngeal erythema.  Eyes:     General: No scleral icterus.       Right eye: No discharge.        Left eye: No discharge.     Extraocular Movements: Extraocular movements intact.     Conjunctiva/sclera: Conjunctivae normal.  Neck:     Vascular: No carotid bruit.  Cardiovascular:     Rate  and Rhythm: Normal rate and regular rhythm.     Pulses: Normal pulses.     Heart sounds: Normal heart sounds. No murmur heard.    No friction rub. No gallop.  Pulmonary:     Effort: Pulmonary effort is normal. No respiratory distress.     Breath sounds: Normal breath sounds. No stridor. No wheezing, rhonchi or rales.  Chest:     Chest wall: No tenderness.  Abdominal:     General: Bowel sounds are normal. There is no distension.     Palpations: Abdomen is soft. There is no mass.     Tenderness: There is no abdominal tenderness. There is no right CVA tenderness, left CVA tenderness, guarding or rebound.     Hernia: No hernia is present.  Musculoskeletal:        General: No swelling, tenderness, deformity or signs of injury.     Cervical back: Normal range of motion and neck supple. No rigidity or tenderness.     Right lower leg: No edema.     Left lower leg: No edema.  Lymphadenopathy:     Cervical: No cervical adenopathy.  Skin:    General: Skin is warm and dry.     Capillary Refill: Capillary refill takes less than 2 seconds.     Coloration: Skin is not jaundiced or pale.     Findings: No bruising, erythema, lesion or rash.  Neurological:     Mental Status: He is alert and oriented to person, place, and time.     Cranial Nerves: No cranial nerve deficit.     Sensory: No sensory deficit.     Motor: No weakness.     Coordination: Coordination normal.     Gait: Gait normal.     Deep Tendon Reflexes: Reflexes normal.  Psychiatric:        Mood and Affect: Mood normal.        Behavior: Behavior normal.        Thought Content: Thought content normal.        Judgment: Judgment normal.     No results found for any visits on 12/31/22.     Assessment & Plan:    Routine Health Maintenance and Physical Exam  Immunization History  Administered Date(s) Administered   Influenza, Seasonal, Injecte, Preservative Fre 12/30/2020, 12/31/2022    Health Maintenance  Topic Date Due    HPV VACCINES (1 - Male 3-dose series) Never done   HIV Screening  Never done   DTaP/Tdap/Td (1 - Tdap) Never done   COVID-19 Vaccine (1 - 2023-24 season) Never done   INFLUENZA VACCINE  Completed   Hepatitis C Screening  Discontinued    Discussed health benefits of physical activity, and encouraged him to engage in regular exercise appropriate for his age and condition.  Problem List Items Addressed This Visit       Other   Annual physical exam -  Primary    Annual exam as documented.  Counseling done include healthy lifestyle involving committing to 150 minutes of exercise per week, heart healthy diet, and attaining healthy weight. The importance of adequate sleep also discussed.  Regular use of seat belt and home safety were also discussed . Changes in health habits are decided on by patient with goals and time frames set for achieving them. Immunization and cancer screening  needs are specifically addressed at this visit.  Routine fasting labs ordered Flu vaccine given today Patient advised to get HPV vaccine and Tdap vaccine if not up-to-date      Need for influenza vaccination    Patient educated on CDC recommendation for the vaccine. Verbal consent was obtained from the patient, vaccine administered by nurse, no sign of adverse reactions noted at this time. Patient education on arm soreness and use of tylenol  for this patient  was discussed. Patient educated on the signs and symptoms of adverse effect and advise to contact the office if they occur. Vaccine information sheet given to patient.        Relevant Orders   Flu vaccine trivalent PF, 6mos and older(Flulaval,Afluria,Fluarix,Fluzone) (Completed)   Other Visit Diagnoses     Screening for endocrine, nutritional, metabolic and immunity disorder       Relevant Orders   CBC   CMP14+EGFR   Lipid panel   HIV Antibody (routine testing w rflx)      Return in about 1 year (around 12/31/2023) for CPE.     Donell Beers, FNP

## 2022-12-31 NOTE — Assessment & Plan Note (Addendum)
Annual exam as documented.  Counseling done include healthy lifestyle involving committing to 150 minutes of exercise per week, heart healthy diet, and attaining healthy weight. The importance of adequate sleep also discussed.  Regular use of seat belt and home safety were also discussed . Changes in health habits are decided on by patient with goals and time frames set for achieving them. Immunization and cancer screening  needs are specifically addressed at this visit.  Routine fasting labs ordered Flu vaccine given today Patient advised to get HPV vaccine and Tdap vaccine if not up-to-date

## 2022-12-31 NOTE — Assessment & Plan Note (Signed)

## 2022-12-31 NOTE — Patient Instructions (Signed)

## 2023-01-01 LAB — LIPID PANEL
Chol/HDL Ratio: 3.9 ratio (ref 0.0–5.0)
Cholesterol, Total: 151 mg/dL (ref 100–199)
HDL: 39 mg/dL — ABNORMAL LOW (ref >39)
LDL Chol Calc (NIH): 88 mg/dL (ref 0–99)
Triglycerides: 136 mg/dL (ref 0–149)
VLDL Cholesterol Cal: 24 mg/dL (ref 5–40)

## 2023-01-01 LAB — CBC
Hematocrit: 45.5 % (ref 37.5–51.0)
Hemoglobin: 15 g/dL (ref 13.0–17.7)
MCH: 30.6 pg (ref 26.6–33.0)
MCHC: 33 g/dL (ref 31.5–35.7)
MCV: 93 fL (ref 79–97)
Platelets: 191 10*3/uL (ref 150–450)
RBC: 4.9 x10E6/uL (ref 4.14–5.80)
RDW: 11.5 % — ABNORMAL LOW (ref 11.6–15.4)
WBC: 4.6 10*3/uL (ref 3.4–10.8)

## 2023-01-01 LAB — CMP14+EGFR
ALT: 10 [IU]/L (ref 0–44)
AST: 13 [IU]/L (ref 0–40)
Albumin: 4.5 g/dL (ref 4.3–5.2)
Alkaline Phosphatase: 47 [IU]/L (ref 44–121)
BUN/Creatinine Ratio: 14 (ref 9–20)
BUN: 14 mg/dL (ref 6–20)
Bilirubin Total: 0.9 mg/dL (ref 0.0–1.2)
CO2: 26 mmol/L (ref 20–29)
Calcium: 9.3 mg/dL (ref 8.7–10.2)
Chloride: 102 mmol/L (ref 96–106)
Creatinine, Ser: 0.97 mg/dL (ref 0.76–1.27)
Globulin, Total: 2.8 g/dL (ref 1.5–4.5)
Glucose: 87 mg/dL (ref 70–99)
Potassium: 4.1 mmol/L (ref 3.5–5.2)
Sodium: 140 mmol/L (ref 134–144)
Total Protein: 7.3 g/dL (ref 6.0–8.5)
eGFR: 112 mL/min/{1.73_m2} (ref >59)

## 2023-01-01 LAB — HIV ANTIBODY (ROUTINE TESTING W REFLEX): HIV Screen 4th Generation wRfx: NONREACTIVE

## 2023-12-31 ENCOUNTER — Ambulatory Visit: Payer: Self-pay | Admitting: Nurse Practitioner

## 2024-01-03 ENCOUNTER — Ambulatory Visit (INDEPENDENT_AMBULATORY_CARE_PROVIDER_SITE_OTHER): Payer: Self-pay | Admitting: Nurse Practitioner

## 2024-01-03 ENCOUNTER — Encounter: Payer: Self-pay | Admitting: Nurse Practitioner

## 2024-01-03 VITALS — BP 121/72 | HR 72 | Ht 70.0 in | Wt 164.8 lb

## 2024-01-03 DIAGNOSIS — Z1329 Encounter for screening for other suspected endocrine disorder: Secondary | ICD-10-CM

## 2024-01-03 DIAGNOSIS — Z130 Encounter for screening for diseases of the blood and blood-forming organs and certain disorders involving the immune mechanism: Secondary | ICD-10-CM

## 2024-01-03 DIAGNOSIS — Z13228 Encounter for screening for other metabolic disorders: Secondary | ICD-10-CM

## 2024-01-03 DIAGNOSIS — Z0000 Encounter for general adult medical examination without abnormal findings: Secondary | ICD-10-CM | POA: Diagnosis not present

## 2024-01-03 DIAGNOSIS — Z23 Encounter for immunization: Secondary | ICD-10-CM

## 2024-01-03 DIAGNOSIS — H6121 Impacted cerumen, right ear: Secondary | ICD-10-CM | POA: Insufficient documentation

## 2024-01-03 DIAGNOSIS — Z1321 Encounter for screening for nutritional disorder: Secondary | ICD-10-CM

## 2024-01-03 NOTE — Assessment & Plan Note (Signed)
 Adult Wellness Visit Routine wellness visit with no acute issues. Blood pressure controlled. No medication allergies or significant family history changes. Engages in regular physical activity and maintains a balanced diet and sleep. - Ordered routine laboratory tests. - Administered flu shot. - Encouraged 30 minutes of exercise five days a week. - Recommended heart-healthy, low salt, low fat diet. - Advised 7-8 hours of sleep nightly. - Encouraged hydration with at least 64 ounces of water daily. - Advised use of seatbelt in car.

## 2024-01-03 NOTE — Progress Notes (Signed)
 Complete physical exam  Patient: Aaron Shea   DOB: 2000-05-03   23 y.o. Male  MRN: [id number]  Subjective:    No chief complaint on file.  Discussed the use of AI scribe software for clinical note transcription with the patient, who gave verbal consent to proceed.  History of Present Illness Aaron Shea is a 23 year old male who presents for an annual physical exam.  He has no current medical complaints and denies any recent surgeries. He is not taking any medications and has no known allergies.  He maintains a healthy lifestyle, abstaining from alcohol, smoking, and drug use. He lives with his parents and stays active by playing soccer. He follows a general diet and reports adequate sleep.  No fever, chills, chest pain, shortness of breath, abdominal pain, vomiting, or hearing issues.    Assessment & Plan       Most recent fall risk assessment:     No data to display           Most recent depression screenings:    01/03/2024    9:41 AM 12/31/2022    8:51 AM  PHQ 2/9 Scores  PHQ - 2 Score 0 0  PHQ- 9 Score 0         Patient Care Team: Marlean Mortell R, FNP as PCP - General (Nurse Practitioner)   No outpatient medications prior to visit.   No facility-administered medications prior to visit.    Review of Systems  Constitutional:  Negative for appetite change, chills, fatigue and fever.  HENT:  Negative for congestion, postnasal drip, rhinorrhea and sneezing.   Eyes:  Negative for pain, discharge and itching.  Respiratory:  Negative for cough, shortness of breath and wheezing.   Cardiovascular:  Negative for chest pain, palpitations and leg swelling.  Gastrointestinal:  Negative for abdominal pain, constipation, nausea and vomiting.  Endocrine: Negative for cold intolerance, heat intolerance and polydipsia.  Genitourinary:  Negative for difficulty urinating, dysuria, flank pain and frequency.  Musculoskeletal:  Negative for  arthralgias, back pain, joint swelling and myalgias.  Skin:  Negative for color change, pallor, rash and wound.  Neurological:  Negative for dizziness, facial asymmetry, weakness, numbness and headaches.  Psychiatric/Behavioral:  Negative for behavioral problems, confusion, self-injury and suicidal ideas.        Objective:     BP 121/72   Pulse 72   Ht 5' 10 (1.778 m)   Wt 164 lb 12.8 oz (74.8 kg)   SpO2 100%   BMI 23.65 kg/m    Physical Exam Vitals and nursing note reviewed.  Constitutional:      General: He is not in acute distress.    Appearance: Normal appearance. He is not ill-appearing, toxic-appearing or diaphoretic.  HENT:     Right Ear: Tympanic membrane, ear canal and external ear normal. There is no impacted cerumen.     Left Ear: Tympanic membrane, ear canal and external ear normal. There is no impacted cerumen.     Nose: Nose normal. No congestion or rhinorrhea.     Mouth/Throat:     Mouth: Mucous membranes are moist.     Pharynx: Oropharynx is clear. No oropharyngeal exudate or posterior oropharyngeal erythema.  Eyes:     General: No scleral icterus.       Right eye: No discharge.        Left eye: No discharge.     Extraocular Movements: Extraocular movements intact.     Conjunctiva/sclera: Conjunctivae normal.  Neck:  Vascular: No carotid bruit.  Cardiovascular:     Rate and Rhythm: Normal rate and regular rhythm.     Pulses: Normal pulses.     Heart sounds: Normal heart sounds. No murmur heard.    No friction rub. No gallop.  Pulmonary:     Effort: Pulmonary effort is normal. No respiratory distress.     Breath sounds: Normal breath sounds. No stridor. No wheezing, rhonchi or rales.  Chest:     Chest wall: No tenderness.  Abdominal:     General: Bowel sounds are normal. There is no distension.     Palpations: Abdomen is soft. There is no mass.     Tenderness: There is no abdominal tenderness. There is no right CVA tenderness, left CVA  tenderness, guarding or rebound.     Hernia: No hernia is present.  Musculoskeletal:        General: No swelling, tenderness, deformity or signs of injury.     Cervical back: Normal range of motion and neck supple. No rigidity or tenderness.     Right lower leg: No edema.     Left lower leg: No edema.  Lymphadenopathy:     Cervical: No cervical adenopathy.  Skin:    General: Skin is warm and dry.     Capillary Refill: Capillary refill takes less than 2 seconds.     Coloration: Skin is not jaundiced or pale.     Findings: No bruising, erythema, lesion or rash.  Neurological:     Mental Status: He is alert and oriented to person, place, and time.     Cranial Nerves: No cranial nerve deficit.     Motor: No weakness.     Gait: Gait normal.  Psychiatric:        Mood and Affect: Mood normal.        Behavior: Behavior normal.        Thought Content: Thought content normal.        Judgment: Judgment normal.     No results found for any visits on 01/03/24.     Assessment & Plan:    Routine Health Maintenance and Physical Exam  Immunization History  Administered Date(s) Administered   DTaP 07/14/1999, 11/16/1999, 08/31/2000, 05/13/2003, 09/15/2019   HIB (PRP-OMP) 07/14/1999, 09/15/1999, 11/16/1999, 08/31/2000   Hepatitis A, Ped/Adol-2 Dose 03/01/2006, 08/09/2009   Hepatitis B, PED/ADOLESCENT 05/14/1999, 07/14/1999, 02/26/2000   Hpv-Unspecified 11/28/2010, 01/04/2012, 04/02/2013   IPV 07/14/1999, 09/15/1999, 06/10/2000, 05/13/2003   Influenza, Seasonal, Injecte, Preservative Fre 12/30/2020, 12/31/2022   Influenza-Unspecified 01/14/2008, 02/17/2008, 04/02/2013   MMR 06/10/2000, 05/13/2003   PFIZER(Purple Top)SARS-COV-2 Vaccination 09/27/2019, 10/18/2019   Pneumococcal-Unspecified 07/14/1999, 09/15/1999, 11/16/1999, 05/17/2001   Tdap 05/08/2010, 05/08/2019   Varicella 12/07/2000, 03/01/2006    Health Maintenance  Topic Date Due   Influenza Vaccine  09/13/2023   COVID-19  Vaccine (3 - 2025-26 season) 10/14/2023   DTaP/Tdap/Td (8 - Td or Tdap) 09/14/2029   Hepatitis B Vaccines 19-59 Average Risk  Completed   HPV VACCINES  Completed   HIV Screening  Completed   Pneumococcal Vaccine  Aged Out   Meningococcal B Vaccine  Aged Out   Hepatitis C Screening  Discontinued    Discussed health benefits of physical activity, and encouraged him to engage in regular exercise appropriate for his age and condition.  Problem List Items Addressed This Visit       Nervous and Auditory   Excessive cerumen in ear canal, right    Cerumen present without hearing issues. TM normal  -  Recommended wax remover solution from [employer]. - Advised against using Q-tips to prevent eardrum damage.        Other   Annual physical exam - Primary   Adult Wellness Visit Routine wellness visit with no acute issues. Blood pressure controlled. No medication allergies or significant family history changes. Engages in regular physical activity and maintains a balanced diet and sleep. - Ordered routine laboratory tests. - Administered flu shot. - Encouraged 30 minutes of exercise five days a week. - Recommended heart-healthy, low salt, low fat diet. - Advised 7-8 hours of sleep nightly. - Encouraged hydration with at least 64 ounces of water daily. - Advised use of seatbelt in car.       Need for influenza vaccination   Relevant Orders   Flu vaccine trivalent PF, 6mos and older(Flulaval,Afluria,Fluarix,Fluzone)   Other Visit Diagnoses       Screening for endocrine, nutritional, metabolic and immunity disorder       Relevant Orders   CBC   CMP14+EGFR   Lipid panel      Return in about 1 year (around 01/02/2025).     Renatta Shrieves R Solomon Skowronek, FNP

## 2024-01-03 NOTE — Patient Instructions (Signed)

## 2024-01-03 NOTE — Assessment & Plan Note (Signed)
  Cerumen present without hearing issues. TM normal  - Recommended wax remover solution from [employer]. - Advised against using Q-tips to prevent eardrum damage.

## 2024-01-03 NOTE — Progress Notes (Signed)
 SABRA

## 2024-01-04 LAB — CBC
Hematocrit: 49.6 % (ref 37.5–51.0)
Hemoglobin: 16.2 g/dL (ref 13.0–17.7)
MCH: 30.7 pg (ref 26.6–33.0)
MCHC: 32.7 g/dL (ref 31.5–35.7)
MCV: 94 fL (ref 79–97)
Platelets: 218 x10E3/uL (ref 150–450)
RBC: 5.28 x10E6/uL (ref 4.14–5.80)
RDW: 11.6 % (ref 11.6–15.4)
WBC: 5.1 x10E3/uL (ref 3.4–10.8)

## 2024-01-04 LAB — CMP14+EGFR
ALT: 22 IU/L (ref 0–44)
AST: 21 IU/L (ref 0–40)
Albumin: 4.9 g/dL (ref 4.3–5.2)
Alkaline Phosphatase: 63 IU/L (ref 47–123)
BUN/Creatinine Ratio: 17 (ref 9–20)
BUN: 16 mg/dL (ref 6–20)
Bilirubin Total: 0.8 mg/dL (ref 0.0–1.2)
CO2: 27 mmol/L (ref 20–29)
Calcium: 9.6 mg/dL (ref 8.7–10.2)
Chloride: 98 mmol/L (ref 96–106)
Creatinine, Ser: 0.95 mg/dL (ref 0.76–1.27)
Globulin, Total: 2.7 g/dL (ref 1.5–4.5)
Glucose: 86 mg/dL (ref 70–99)
Potassium: 3.9 mmol/L (ref 3.5–5.2)
Sodium: 138 mmol/L (ref 134–144)
Total Protein: 7.6 g/dL (ref 6.0–8.5)
eGFR: 115 mL/min/1.73 (ref >59)

## 2024-01-04 LAB — LIPID PANEL
Chol/HDL Ratio: 4.7 ratio (ref 0.0–5.0)
Cholesterol, Total: 204 mg/dL — ABNORMAL HIGH (ref 100–199)
HDL: 43 mg/dL (ref >39)
LDL Chol Calc (NIH): 138 mg/dL — ABNORMAL HIGH (ref 0–99)
Triglycerides: 126 mg/dL (ref 0–149)
VLDL Cholesterol Cal: 23 mg/dL (ref 5–40)

## 2024-01-06 ENCOUNTER — Ambulatory Visit: Payer: Self-pay | Admitting: Nurse Practitioner

## 2024-02-03 ENCOUNTER — Telehealth: Payer: Self-pay

## 2024-02-03 NOTE — Telephone Encounter (Signed)
 Copied from CRM #[id number]. Topic: Clinical - Lab/Test Results >> Jan 31, 2024  5:21 PM Delon DASEN wrote: Reason for CRM: read labs verbatim, would like order for A1c

## 2025-01-04 ENCOUNTER — Encounter: Payer: Self-pay | Admitting: Nurse Practitioner
